# Patient Record
Sex: Male | Born: 1965 | Race: White | Hispanic: No | Marital: Married | Smoking: Never smoker
Health system: Southern US, Community
[De-identification: ages and names within clinical notes are randomized; demographics above are authoritative.]

## PROBLEM LIST (undated history)

## (undated) DIAGNOSIS — E079 Disorder of thyroid, unspecified: Secondary | ICD-10-CM

## (undated) DIAGNOSIS — E785 Hyperlipidemia, unspecified: Secondary | ICD-10-CM

## (undated) HISTORY — PX: APPENDECTOMY: SHX54

---

## 2012-01-30 DIAGNOSIS — Z9049 Acquired absence of other specified parts of digestive tract: Secondary | ICD-10-CM | POA: Insufficient documentation

## 2012-11-10 DIAGNOSIS — L290 Pruritus ani: Secondary | ICD-10-CM | POA: Insufficient documentation

## 2013-08-13 ENCOUNTER — Emergency Department (INDEPENDENT_AMBULATORY_CARE_PROVIDER_SITE_OTHER)
Admission: EM | Admit: 2013-08-13 | Discharge: 2013-08-13 | Disposition: A | Discharge status: Home / Self Care | Payer: Managed Care, Other (non HMO) | Source: Home / Self Care | Attending: Emergency Medicine | Admitting: Emergency Medicine

## 2013-08-13 ENCOUNTER — Encounter: Payer: Self-pay | Admitting: Emergency Medicine

## 2013-08-13 DIAGNOSIS — J029 Acute pharyngitis, unspecified: Secondary | ICD-10-CM

## 2013-08-13 DIAGNOSIS — J209 Acute bronchitis, unspecified: Secondary | ICD-10-CM

## 2013-08-13 LAB — POCT RAPID STREP A (OFFICE): Rapid Strep A Screen: NEGATIVE

## 2013-08-13 MED ORDER — AZITHROMYCIN 250 MG PO TABS: ORAL_TABLET | ORAL | Status: DC

## 2013-08-13 MED ORDER — PROMETHAZINE-CODEINE 6.25-10 MG/5ML PO SYRP: ORAL_SOLUTION | ORAL | Status: DC

## 2013-08-13 NOTE — ED Notes (Signed)
Melvin Clayton reports sore throat, fever, chills, sweats and cough for 4 days.

## 2013-08-13 NOTE — ED Provider Notes (Signed)
CSN: [id number]     Arrival date & time 08/13/13  1344 History   First MD Initiated Contact with Patient 08/13/13 1438     Chief Complaint  Patient presents with  . Sore Throat    4 days  . Fever  . Chills   (Consider location/radiation/quality/duration/timing/severity/associated sxs/prior Treatment) HPI Keagon reports sore throat, fever, chills, sweats and cough for 4 days.   URI HISTORY  Calvert is a 47 y.o. male who complains of onset of gradually progressive cold/cough symptoms for 4 days.  Have been using over-the-counter treatment which helps a little bit.  Positive chills/sweats +  Fever  +  Nasal congestion +  Discolored Post-nasal drainage No sinus pain/pressure Positive sore throat  +  Cough, severe, hacking, occasionally productive of discolored sputum. Cough keeps him up at night No wheezing +chest congestion No hemoptysis No shortness of breath No pleuritic pain, but it hurts to cough  No itchy/red eyes No earache  No nausea No vomiting No abdominal pain No diarrhea  No skin rashes +  mildFatigue No myalgias or arthralgias No headache  History reviewed. No pertinent past medical history. History reviewed. No pertinent past surgical history. History reviewed. No pertinent family history. History  Substance Use Topics  . Smoking status: Never Smoker   . Smokeless tobacco: Never Used  . Alcohol Use: No    Review of Systems  Allergies  Review of patient's allergies indicates no known allergies.  Home Medications   Current Outpatient Rx  Name  Route  Sig  Dispense  Refill  . Pseudoeph-Bromphen-DM (COLD & COUGH DM PO)   Oral   Take by mouth.         Marland Kitchen azithromycin (ZITHROMAX Z-PAK) 250 MG tablet      Take 2 tablets on day one, then 1 tablet daily on days 2 through 5   1 each   0   . promethazine-codeine (PHENERGAN WITH CODEINE) 6.25-10 MG/5ML syrup      Take 1-2 teaspoons every 4-6 hours as needed for cough. May cause drowsiness.  120 mL   0    BP 113/73  Pulse 76  Temp(Src) 98.4 F (36.9 C) (Oral)  Ht 6\' 4"  (1.93 m)  Wt 218 lb (98.884 kg)  BMI 26.55 kg/m2  SpO2 98% Physical Exam  Nursing note and vitals reviewed. Constitutional: He is oriented to person, place, and time. He appears well-developed and well-nourished. No distress.  HENT:  Head: Normocephalic and atraumatic.  Right Ear: Tympanic membrane normal.  Left Ear: Tympanic membrane normal.  Nose: Nose normal.  Mouth/Throat: Mucous membranes are normal. No oral lesions. No trismus in the jaw. Posterior oropharyngeal erythema present. No oropharyngeal exudate, posterior oropharyngeal edema or tonsillar abscesses.  Eyes: Right eye exhibits no discharge. Left eye exhibits no discharge. No scleral icterus.  Neck: Neck supple.  Cardiovascular: Normal rate, regular rhythm and normal heart sounds.   Pulmonary/Chest: No respiratory distress. He has no wheezes. He has rhonchi. He has no rales.  Lymphadenopathy:    He has no cervical adenopathy.  Neurological: He is alert and oriented to person, place, and time.  Skin: Skin is warm and dry.    ED Course  Procedures (including critical care time) Labs Review Labs Reviewed  POCT RAPID STREP A (OFFICE) - Normal   Imaging Review No results found.  EKG Interpretation    Date/Time:    Ventricular Rate:    PR Interval:    QRS Duration:   QT Interval:  QTC Calculation:   R Axis:     Text Interpretation:              MDM   1. Acute bronchitis   2. Sore throat    Rapid strep test negative. Treatment options discussed. Z-Pak Promethazine with codeine cough syrup prescribed. Precautions discussed. Other symptomatic care discussed Followup with PCP within one week, sooner if worse or new symptoms Precautions discussed. Red flags discussed. Questions invited and answered. Patient voiced understanding and agreement.    Lajean Manes, MD 08/13/13 [id number]

## 2016-05-24 ENCOUNTER — Other Ambulatory Visit: Payer: Self-pay | Admitting: Unknown Physician Specialty

## 2016-05-24 DIAGNOSIS — M545 Low back pain: Secondary | ICD-10-CM

## 2016-06-04 ENCOUNTER — Ambulatory Visit (INDEPENDENT_AMBULATORY_CARE_PROVIDER_SITE_OTHER): Payer: Managed Care, Other (non HMO)

## 2016-06-04 DIAGNOSIS — M545 Low back pain: Secondary | ICD-10-CM | POA: Diagnosis not present

## 2017-06-23 ENCOUNTER — Emergency Department (INDEPENDENT_AMBULATORY_CARE_PROVIDER_SITE_OTHER)
Admission: EM | Admit: 2017-06-23 | Discharge: 2017-06-23 | Disposition: A | Discharge status: Home / Self Care | Payer: BLUE CROSS/BLUE SHIELD | Source: Home / Self Care | Attending: Emergency Medicine | Admitting: Emergency Medicine

## 2017-06-23 ENCOUNTER — Encounter: Payer: Self-pay | Admitting: *Deleted

## 2017-06-23 ENCOUNTER — Emergency Department (INDEPENDENT_AMBULATORY_CARE_PROVIDER_SITE_OTHER): Payer: BLUE CROSS/BLUE SHIELD

## 2017-06-23 DIAGNOSIS — J209 Acute bronchitis, unspecified: Secondary | ICD-10-CM

## 2017-06-23 DIAGNOSIS — R05 Cough: Secondary | ICD-10-CM | POA: Diagnosis not present

## 2017-06-23 MED ORDER — AZITHROMYCIN 250 MG PO TABS: ORAL_TABLET | ORAL | 0 refills | Status: DC

## 2017-06-23 MED ORDER — BENZONATATE 100 MG PO CAPS: 100.0000 mg | ORAL_CAPSULE | Freq: Three times a day (TID) | ORAL | 0 refills | Status: DC | PRN

## 2017-06-23 NOTE — ED Provider Notes (Signed)
[facility]    CSN: [id number] Arrival date & time: 06/23/17  0831     History   Chief Complaint Chief Complaint  Patient presents with  . Cough    HPI Melvin Clayton is a 51 y.o. male.  Patient presents with a 9 day history of cough. He has had minimal head congestion. He has had no fever. His cough is productive of a minimal amount of phlegm. He denies any wheezing. He states he has no history of recurrent problems with his lungs. He has been taking over-the-counter medication without much improvement.No one else at home or work is sick.  Cough  Associated symptoms: no chills, no fever, no shortness of breath, no sore throat and no wheezing     History reviewed. No pertinent past medical history.  There are no active problems to display for this patient.   Past Surgical History:  Procedure Laterality Date  . APPENDECTOMY         Home Medications    Prior to Admission medications   Medication Sig Start Date End Date Taking? Authorizing Provider  Multiple Vitamin (MULTI-VITAMIN PO) Take by mouth.   Yes [provider]    Family History History reviewed. No pertinent family history.  Social History Social History   Tobacco Use  . Smoking status: Never Smoker  . Smokeless tobacco: Never Used  Substance Use Topics  . Alcohol use: Yes    Comment: 1 q wk  . Drug use: No     Allergies   Patient has no known allergies.   Review of Systems Review of Systems  Constitutional: Negative for activity change, chills and fever.  HENT: Positive for congestion. Negative for sore throat.   Respiratory: Positive for cough. Negative for choking, chest tightness, shortness of breath and wheezing.   Cardiovascular: Negative.      Physical Exam Triage Vital Signs ED Triage Vitals  Enc Vitals Group     BP 06/23/17 0908 126/78     Pulse Rate 06/23/17 0908 69     Resp 06/23/17 0908 16     Temp 06/23/17 0908 98.5 F (36.9 C)     Temp Source  06/23/17 0908 Oral     SpO2 06/23/17 0908 96 %     Weight 06/23/17 0908 231 lb (104.8 kg)     Height 06/23/17 0908 6\' 2"  (1.88 m)     Head Circumference --      Peak Flow --      Pain Score 06/23/17 0909 0     Pain Loc --      Pain Edu? --      Excl. in GC? --    No data found.  Updated Vital Signs BP 126/78 (BP Location: Left Arm)   Pulse 69   Temp 98.5 F (36.9 C) (Oral)   Resp 16   Ht 6\' 2"  (1.88 m)   Wt 231 lb (104.8 kg)   SpO2 96%   BMI 29.66 kg/m   Visual Acuity Right Eye Distance:   Left Eye Distance:   Bilateral Distance:    Right Eye Near:   Left Eye Near:    Bilateral Near:     Physical Exam  Constitutional: He appears well-developed and well-nourished.  HENT:  Head: Normocephalic.  Nose: Nose normal.  Mouth/Throat: Oropharynx is clear and moist. No oropharyngeal exudate.  Neck: Normal range of motion. Neck supple.  Cardiovascular: Normal rate, regular rhythm and normal heart sounds.  Pulmonary/Chest: Effort normal and breath  sounds normal. No stridor. No respiratory distress. He has no wheezes. He has no rales. He exhibits no tenderness.     UC Treatments / Results  Labs (all labs ordered are listed, but only abnormal results are displayed) Labs Reviewed - No data to display  EKG  EKG Interpretation None       Radiology Dg Chest 2 View  Result Date: 06/23/2017 CLINICAL DATA:  Cough. EXAM: CHEST  2 VIEW COMPARISON:  None. FINDINGS: The heart size and mediastinal contours are within normal limits. Both lungs are clear. The visualized skeletal structures are unremarkable. IMPRESSION: Normal chest. Electronically Signed   By: Francene BoyersJames  Maxwell M.D.   On: 06/23/2017 09:51    Procedures Procedures (including critical care time)  Medications Ordered in UC Medications - No data to display   Initial Impression / Assessment and Plan / UC Course  I have reviewed the triage vital signs and the nursing notes.  Pertinent labs & imaging results that  were available during my care of the patient were reviewed by me and considered in my medical decision making (see chart for details).     Chest x-ray is clear. Will treat with a Z-Pak along with Jerilynn Somessalon Perles patient given instructions regarding need for follow-up.  Final Clinical Impressions(s) / UC Diagnoses   Final diagnoses:  Acute bronchitis, unspecified organism    New Prescriptions This SmartLink is deprecated. Use AVSMEDLIST instead to display the medication list for a patient.   Controlled Substance Prescriptions [place] Controlled Substance Registry consulted? Not Applicable   Collene Gobbleaub, Coen Miyasato A, MD 06/23/17 1001

## 2017-06-23 NOTE — ED Triage Notes (Signed)
Pt c/o nonproductive cough x 9 days. Denies fever. He has taken OTC cough meds with minimal relief.

## 2018-01-20 ENCOUNTER — Emergency Department (INDEPENDENT_AMBULATORY_CARE_PROVIDER_SITE_OTHER)
Admission: EM | Admit: 2018-01-20 | Discharge: 2018-01-20 | Disposition: A | Discharge status: Home / Self Care | Payer: BLUE CROSS/BLUE SHIELD | Source: Home / Self Care | Attending: Family Medicine | Admitting: Family Medicine

## 2018-01-20 ENCOUNTER — Other Ambulatory Visit: Payer: Self-pay

## 2018-01-20 DIAGNOSIS — R350 Frequency of micturition: Secondary | ICD-10-CM

## 2018-01-20 DIAGNOSIS — R42 Dizziness and giddiness: Secondary | ICD-10-CM

## 2018-01-20 DIAGNOSIS — R300 Dysuria: Secondary | ICD-10-CM | POA: Diagnosis not present

## 2018-01-20 DIAGNOSIS — M545 Low back pain, unspecified: Secondary | ICD-10-CM

## 2018-01-20 LAB — POCT URINALYSIS DIP (MANUAL ENTRY)
Bilirubin, UA: NEGATIVE
Glucose, UA: NEGATIVE mg/dL
Ketones, POC UA: NEGATIVE mg/dL
Nitrite, UA: NEGATIVE
Protein Ur, POC: NEGATIVE mg/dL
Spec Grav, UA: 1.015 (ref 1.010–1.025)
Urobilinogen, UA: 0.2 E.U./dL
pH, UA: 6 (ref 5.0–8.0)

## 2018-01-20 MED ORDER — SULFAMETHOXAZOLE-TRIMETHOPRIM 800-160 MG PO TABS: 1.0000 | ORAL_TABLET | Freq: Two times a day (BID) | ORAL | 0 refills | Status: AC

## 2018-01-20 NOTE — ED Provider Notes (Signed)
[facility]    CSN: [id number] Arrival date & time: 01/20/18  1746     History   Chief Complaint Chief Complaint  Patient presents with  . Dizziness  . Fever  . Back Pain  . Urinary Frequency    HPI Melvin Clayton is a 52 y.o. male.   HPI Melvin Clayton is a 52 y.o. male presenting to UC with c/o urinary frequency, dysuria, fatigue, lower back pain, chills and pressure in his bladder since about 10AM yesterday.  He has taken ibuprofen with mild relief. Hx of UTI several years ago.  Denies known fever. Denies vomiting or diarrhea.    History reviewed. No pertinent past medical history.  There are no active problems to display for this patient.   Past Surgical History:  Procedure Laterality Date  . APPENDECTOMY         Home Medications    Prior to Admission medications   Medication Sig Start Date End Date Taking? Authorizing Provider  azithromycin (ZITHROMAX) 250 MG tablet Take 2 tabs PO x 1 dose, then 1 tab PO QD x 4 days 06/23/17   Collene Gobbleaub, Steven A, MD  benzonatate (TESSALON) 100 MG capsule Take 1-2 capsules (100-200 mg total) 3 (three) times daily as needed by mouth for cough. 06/23/17   Collene Gobbleaub, Steven A, MD  Multiple Vitamin (MULTI-VITAMIN PO) Take by mouth.    [provider]  sulfamethoxazole-trimethoprim (BACTRIM DS,SEPTRA DS) 800-160 MG tablet Take 1 tablet by mouth 2 (two) times daily for 7 days. 01/20/18 01/27/18  Lurene ShadowPhelps, Rodney Yera O, PA-C    Family History History reviewed. No pertinent family history.  Social History Social History   Tobacco Use  . Smoking status: Never Smoker  . Smokeless tobacco: Never Used  Substance Use Topics  . Alcohol use: Yes    Comment: 1 q wk  . Drug use: No     Allergies   Patient has no known allergies.   Review of Systems Review of Systems  Constitutional: Positive for chills. Negative for fever.  HENT: Negative for congestion, ear pain, sore throat, trouble swallowing and voice change.     Respiratory: Negative for cough and shortness of breath.   Cardiovascular: Negative for chest pain and palpitations.  Gastrointestinal: Positive for abdominal pain ( bladder pressure) and nausea. Negative for diarrhea and vomiting.  Genitourinary: Positive for dysuria, frequency and urgency. Negative for discharge, flank pain, hematuria and testicular pain.  Musculoskeletal: Positive for back pain. Negative for arthralgias and myalgias.  Skin: Negative for rash.  Neurological: Positive for dizziness. Negative for syncope, light-headedness and headaches.     Physical Exam Triage Vital Signs ED Triage Vitals  Enc Vitals Group     BP 01/20/18 1808 (!) 159/90     Pulse Rate 01/20/18 1808 92     Resp --      Temp 01/20/18 1808 99 F (37.2 C)     Temp Source 01/20/18 1808 Oral     SpO2 01/20/18 1808 99 %     Weight 01/20/18 1809 226 lb (102.5 kg)     Height 01/20/18 1809 6\' 2"  (1.88 m)     Head Circumference --      Peak Flow --      Pain Score 01/20/18 1809 0     Pain Loc --      Pain Edu? --      Excl. in GC? --    No data found.  Updated Vital Signs BP (!) 159/90 (BP Location:  Right Arm) Comment: provider notified  Pulse 92   Temp 99 F (37.2 C) (Oral)   Ht 6\' 2"  (1.88 m)   Wt 226 lb (102.5 kg)   SpO2 99%   BMI 29.02 kg/m   Visual Acuity Right Eye Distance:   Left Eye Distance:   Bilateral Distance:    Right Eye Near:   Left Eye Near:    Bilateral Near:     Physical Exam  Constitutional: He is oriented to person, place, and time. He appears well-developed and well-nourished. No distress.  HENT:  Head: Normocephalic and atraumatic.  Mouth/Throat: Oropharynx is clear and moist.  Eyes: EOM are normal.  Neck: Normal range of motion. Neck supple.  Cardiovascular: Normal rate and regular rhythm.  Pulmonary/Chest: Effort normal and breath sounds normal. No stridor. No respiratory distress. He has no wheezes. He has no rales.  Abdominal: Soft. He exhibits no  distension. There is tenderness in the suprapubic area and left lower quadrant. There is no CVA tenderness.  Musculoskeletal: Normal range of motion.  Neurological: He is alert and oriented to person, place, and time.  Skin: Skin is warm and dry. He is not diaphoretic.  Psychiatric: He has a normal mood and affect. His behavior is normal.  Nursing note and vitals reviewed.    UC Treatments / Results  Labs (all labs ordered are listed, but only abnormal results are displayed) Labs Reviewed  POCT URINALYSIS DIP (MANUAL ENTRY) - Abnormal; Notable for the following components:      Result Value   Clarity, UA cloudy (*)    Blood, UA small (*)    Leukocytes, UA Small (1+) (*)    All other components within normal limits  URINE CULTURE    EKG None  Radiology No results found.  Procedures Procedures (including critical care time)  Medications Ordered in UC Medications - No data to display  Initial Impression / Assessment and Plan / UC Course  I have reviewed the triage vital signs and the nursing notes.  Pertinent labs & imaging results that were available during my care of the patient were reviewed by me and considered in my medical decision making (see chart for details).     Hx and UA c/w UTI Culture sent Will start on antibiotics while culture pending Encouraged to stay well hydrated F/u with PCP as needed.   Final Clinical Impressions(s) / UC Diagnoses   Final diagnoses:  Dysuria  Dizziness  Acute bilateral low back pain without sciatica  Urinary frequency     Discharge Instructions      You may take 500mg  acetaminophen every 4-6 hours or in combination with ibuprofen 400-600mg  every 6-8 hours as needed for pain, inflammation, and fever.  Be sure to drink at least eight 8oz glasses of water to stay well hydrated and get at least 8 hours of sleep at night, preferably more while sick.   Please take antibiotics as prescribed and be sure to complete entire  course even if you start to feel better to ensure infection does not come back.  Please follow up with family medicine in 1 week if not improving.     ED Prescriptions    Medication Sig Dispense Auth. Provider   sulfamethoxazole-trimethoprim (BACTRIM DS,SEPTRA DS) 800-160 MG tablet Take 1 tablet by mouth 2 (two) times daily for 7 days. 14 tablet Lurene Shadow, [place]     Controlled Substance Prescriptions [place] Controlled Substance Registry consulted? Not Applicable   Rolla Plate 01/20/18  1823  

## 2018-01-20 NOTE — Discharge Instructions (Signed)
°  You may take 500mg acetaminophen every 4-6 hours or in combination with ibuprofen 400-600mg every 6-8 hours as needed for pain, inflammation, and fever. ° °Be sure to drink at least eight 8oz glasses of water to stay well hydrated and get at least 8 hours of sleep at night, preferably more while sick.  ° °Please take antibiotics as prescribed and be sure to complete entire course even if you start to feel better to ensure infection does not come back. ° °Please follow up with family medicine in 1 week if not improving.  °

## 2018-01-20 NOTE — ED Triage Notes (Signed)
Started yesterday around 10 am.  Frequency with urination, fatigue, lower back pain.  Has been taking ibuprofen.  Chills, and pressure when feeding.

## 2018-01-22 ENCOUNTER — Emergency Department
Admission: EM | Admit: 2018-01-22 | Discharge: 2018-01-22 | Discharge status: Absent without leave | Payer: Self-pay | Source: Home / Self Care

## 2018-01-22 ENCOUNTER — Other Ambulatory Visit: Payer: Self-pay | Admitting: Emergency Medicine

## 2018-01-22 LAB — URINE CULTURE
MICRO NUMBER:: 90672113
SPECIMEN QUALITY:: ADEQUATE

## 2018-01-22 MED ORDER — CIPROFLOXACIN HCL 500 MG PO TABS: 500.0000 mg | ORAL_TABLET | Freq: Two times a day (BID) | ORAL | 0 refills | Status: DC

## 2018-12-23 ENCOUNTER — Emergency Department (INDEPENDENT_AMBULATORY_CARE_PROVIDER_SITE_OTHER)
Admission: EM | Admit: 2018-12-23 | Discharge: 2018-12-23 | Disposition: A | Discharge status: Home / Self Care | Payer: BLUE CROSS/BLUE SHIELD | Source: Home / Self Care

## 2018-12-23 ENCOUNTER — Other Ambulatory Visit: Payer: Self-pay

## 2018-12-23 ENCOUNTER — Encounter: Payer: Self-pay | Admitting: *Deleted

## 2018-12-23 DIAGNOSIS — L298 Other pruritus: Secondary | ICD-10-CM | POA: Diagnosis not present

## 2018-12-23 HISTORY — DX: Hyperlipidemia, unspecified: E78.5

## 2018-12-23 HISTORY — DX: Disorder of thyroid, unspecified: E07.9

## 2018-12-23 MED ORDER — TRIAMCINOLONE ACETONIDE 0.1 % EX CREA: 1.0000 "application " | TOPICAL_CREAM | Freq: Two times a day (BID) | CUTANEOUS | 0 refills | Status: AC

## 2018-12-23 MED ORDER — CETIRIZINE HCL 10 MG PO TABS: 10.0000 mg | ORAL_TABLET | Freq: Every day | ORAL | 0 refills | Status: AC

## 2018-12-23 MED ORDER — PREDNISONE 50 MG PO TABS: 50.0000 mg | ORAL_TABLET | Freq: Every day | ORAL | 0 refills | Status: AC

## 2018-12-23 MED ORDER — METHYLPREDNISOLONE ACETATE 80 MG/ML IJ SUSP
80.0000 mg | Freq: Once | INTRAMUSCULAR | Status: AC
  Administered 2018-12-23: 80 mg via INTRAMUSCULAR

## 2018-12-23 NOTE — Discharge Instructions (Signed)
°  You were given your first dose of steroids today in the clinic, you may start your oral prednisone tomorrow with breakfast.  You may take the prescribed antihistamine cetirizine to help with inflammation and itching. This medication works best if taken daily for at least 1 week.  Please follow up with family medicine in 1 week if not improving, sooner if symptoms continue to worsen.

## 2018-12-23 NOTE — ED Provider Notes (Signed)
[facility]    CSN: [id number] Arrival date & time: 12/23/18  1004     History   Chief Complaint Chief Complaint  Patient presents with  . Rash    HPI Melvin Clayton is a 53 y.o. male.   HPI Melvin Clayton is a 53 y.o. male presenting to UC with c/o gradually worsening red moderately itchy rash all over his body. He was working in his yard about 2 weeks ago. At the end of the week he developed a rash he believed was from poison ivy, as he has had similar rashes in the past. He completed a 5 day course of prednisone prescribed from an e-visit last week. The rash started to improved but has since worsened after the prednisone was tapered off.  Denies pain, fever, or nausea. No oral swelling or trouble breathing. No other known allergens including new foods or medications.    Past Medical History:  Diagnosis Date  . Hyperlipidemia   . Thyroid disease     Patient Active Problem List   Diagnosis Date Noted  . 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 S/P laparoscopic appendectomy 01/30/2012    Past Surgical History:  Procedure Laterality Date  . APPENDECTOMY         Home Medications    Prior to Admission medications   Medication Sig Start Date End Date Taking? Authorizing Provider  Omega-3 Fatty Acids (FISH OIL PO) Take by mouth.   Yes [provider]  atorvastatin (LIPITOR) 10 MG tablet Take 10 mg by mouth daily. 11/09/18   [provider]  cetirizine (ZYRTEC) 10 MG tablet Take 1 tablet (10 mg total) by mouth daily. 12/23/18   Lurene Shadow, PA-C  glucosamine-chondroitin 500-400 MG tablet Take by mouth.    [provider]  levothyroxine (SYNTHROID) 50 MCG tablet Take 50 mcg by mouth daily. 11/09/18   [provider]  Multiple Vitamin (MULTI-VITAMIN PO) Take by mouth.    [provider]  predniSONE (DELTASONE) 50 MG tablet Take 1 tablet (50 mg total) by mouth daily with breakfast for 5 days. 12/23/18 12/28/18  Lurene Shadow, PA-C   triamcinolone cream (KENALOG) 0.1 % Apply 1 application topically 2 (two) times daily. 12/23/18   Lurene Shadow, PA-C    Family History History reviewed. No pertinent family history.  Social History Social History   Tobacco Use  . Smoking status: Never Smoker  . Smokeless tobacco: Never Used  Substance Use Topics  . Alcohol use: Yes    Comment: 1 q wk  . Drug use: No     Allergies   Patient has no known allergies.   Review of Systems Review of Systems  Constitutional: Negative for chills and fever.  HENT: Negative for facial swelling.   Respiratory: Negative for shortness of breath and wheezing.   Musculoskeletal: Negative for arthralgias, joint swelling and myalgias.  Skin: Positive for rash. Negative for wound.     Physical Exam Triage Vital Signs ED Triage Vitals [12/23/18 1018]  Enc Vitals Group     BP 118/75     Pulse Rate 70     Resp 18     Temp 97.8 F (36.6 C)     Temp Source Oral     SpO2 97 %     Weight 230 lb (104.3 kg)     Height 6\' 2"  (1.88 m)     Head Circumference      Peak Flow      Pain Score 0  Pain Loc      Pain Edu?      Excl. in GC?    No data found.  Updated Vital Signs BP 118/75 (BP Location: Right Arm)   Pulse 70   Temp 97.8 F (36.6 C) (Oral)   Resp 18   Ht 6\' 2"  (1.88 m)   Wt 230 lb (104.3 kg)   SpO2 97%   BMI 29.53 kg/m   Visual Acuity Right Eye Distance:   Left Eye Distance:   Bilateral Distance:    Right Eye Near:   Left Eye Near:    Bilateral Near:     Physical Exam Vitals signs and nursing note reviewed.  Constitutional:      Appearance: Normal appearance. He is well-developed.  HENT:     Head: Normocephalic and atraumatic.     Mouth/Throat:     Mouth: Mucous membranes are moist.     Pharynx: Oropharynx is clear.  Neck:     Musculoskeletal: Normal range of motion.  Cardiovascular:     Rate and Rhythm: Normal rate and regular rhythm.  Pulmonary:     Effort: Pulmonary effort is normal. No  respiratory distress.  Musculoskeletal: Normal range of motion.  Skin:    General: Skin is warm and dry.     Findings: Erythema and rash present.          Comments: Diffuse erythematous papular rash on arms, legs, and abdomen, linear distribution in some areas on lower legs. Rash on abdomen- maculopapular. Non-tender.   Neurological:     Mental Status: He is alert and oriented to person, place, and time.  Psychiatric:        Behavior: Behavior normal.      UC Treatments / Results  Labs (all labs ordered are listed, but only abnormal results are displayed) Labs Reviewed - No data to display  EKG None  Radiology No results found.  Procedures Procedures (including critical care time)  Medications Ordered in UC Medications  methylPREDNISolone acetate (DEPO-MEDROL) injection 80 mg (80 mg Intramuscular Given 12/23/18 1035)    Initial Impression / Assessment and Plan / UC Course  I have reviewed the triage vital signs and the nursing notes.  Pertinent labs & imaging results that were available during my care of the patient were reviewed by me and considered in my medical decision making (see chart for details).     Hx and exam c/w contact dermatitis and possible allergic reaction type rash on Left lower abdomen No evidence of underlying infection or anaphylaxis.   Final Clinical Impressions(s) / UC Diagnoses   Final diagnoses:  Pruritic erythematous rash     Discharge Instructions      You were given your first dose of steroids today in the clinic, you may start your oral prednisone tomorrow with breakfast.  You may take the prescribed antihistamine cetirizine to help with inflammation and itching. This medication works best if taken daily for at least 1 week.  Please follow up with family medicine in 1 week if not improving, sooner if symptoms continue to worsen.    ED Prescriptions    Medication Sig Dispense Auth. Provider   predniSONE (DELTASONE) 50 MG  tablet Take 1 tablet (50 mg total) by mouth daily with breakfast for 5 days. 5 tablet Waylan RocherPhelps, Jahmir Salo O, PA-C   cetirizine (ZYRTEC) 10 MG tablet Take 1 tablet (10 mg total) by mouth daily. 30 tablet Waylan RocherPhelps, Saide Lanuza O, PA-C   triamcinolone cream (KENALOG) 0.1 % Apply 1 application  topically 2 (two) times daily. 30 g Lurene Shadow, PA-C     Controlled Substance Prescriptions [place] Controlled Substance Registry consulted? Not Applicable   Rolla Plate 12/23/18 1102

## 2018-12-23 NOTE — ED Triage Notes (Signed)
Pt c/o rash on his arms, legs and abd x 2 wks. He completed 5 days of prednisone Friday from an e-visit.

## 2019-07-03 IMAGING — DX DG CHEST 2V
2 series · 2 of 2 positions shown · non-contrast
Comparison: None.

CLINICAL DATA: Cough.

EXAM:
CHEST  2 VIEW

[chest pa]
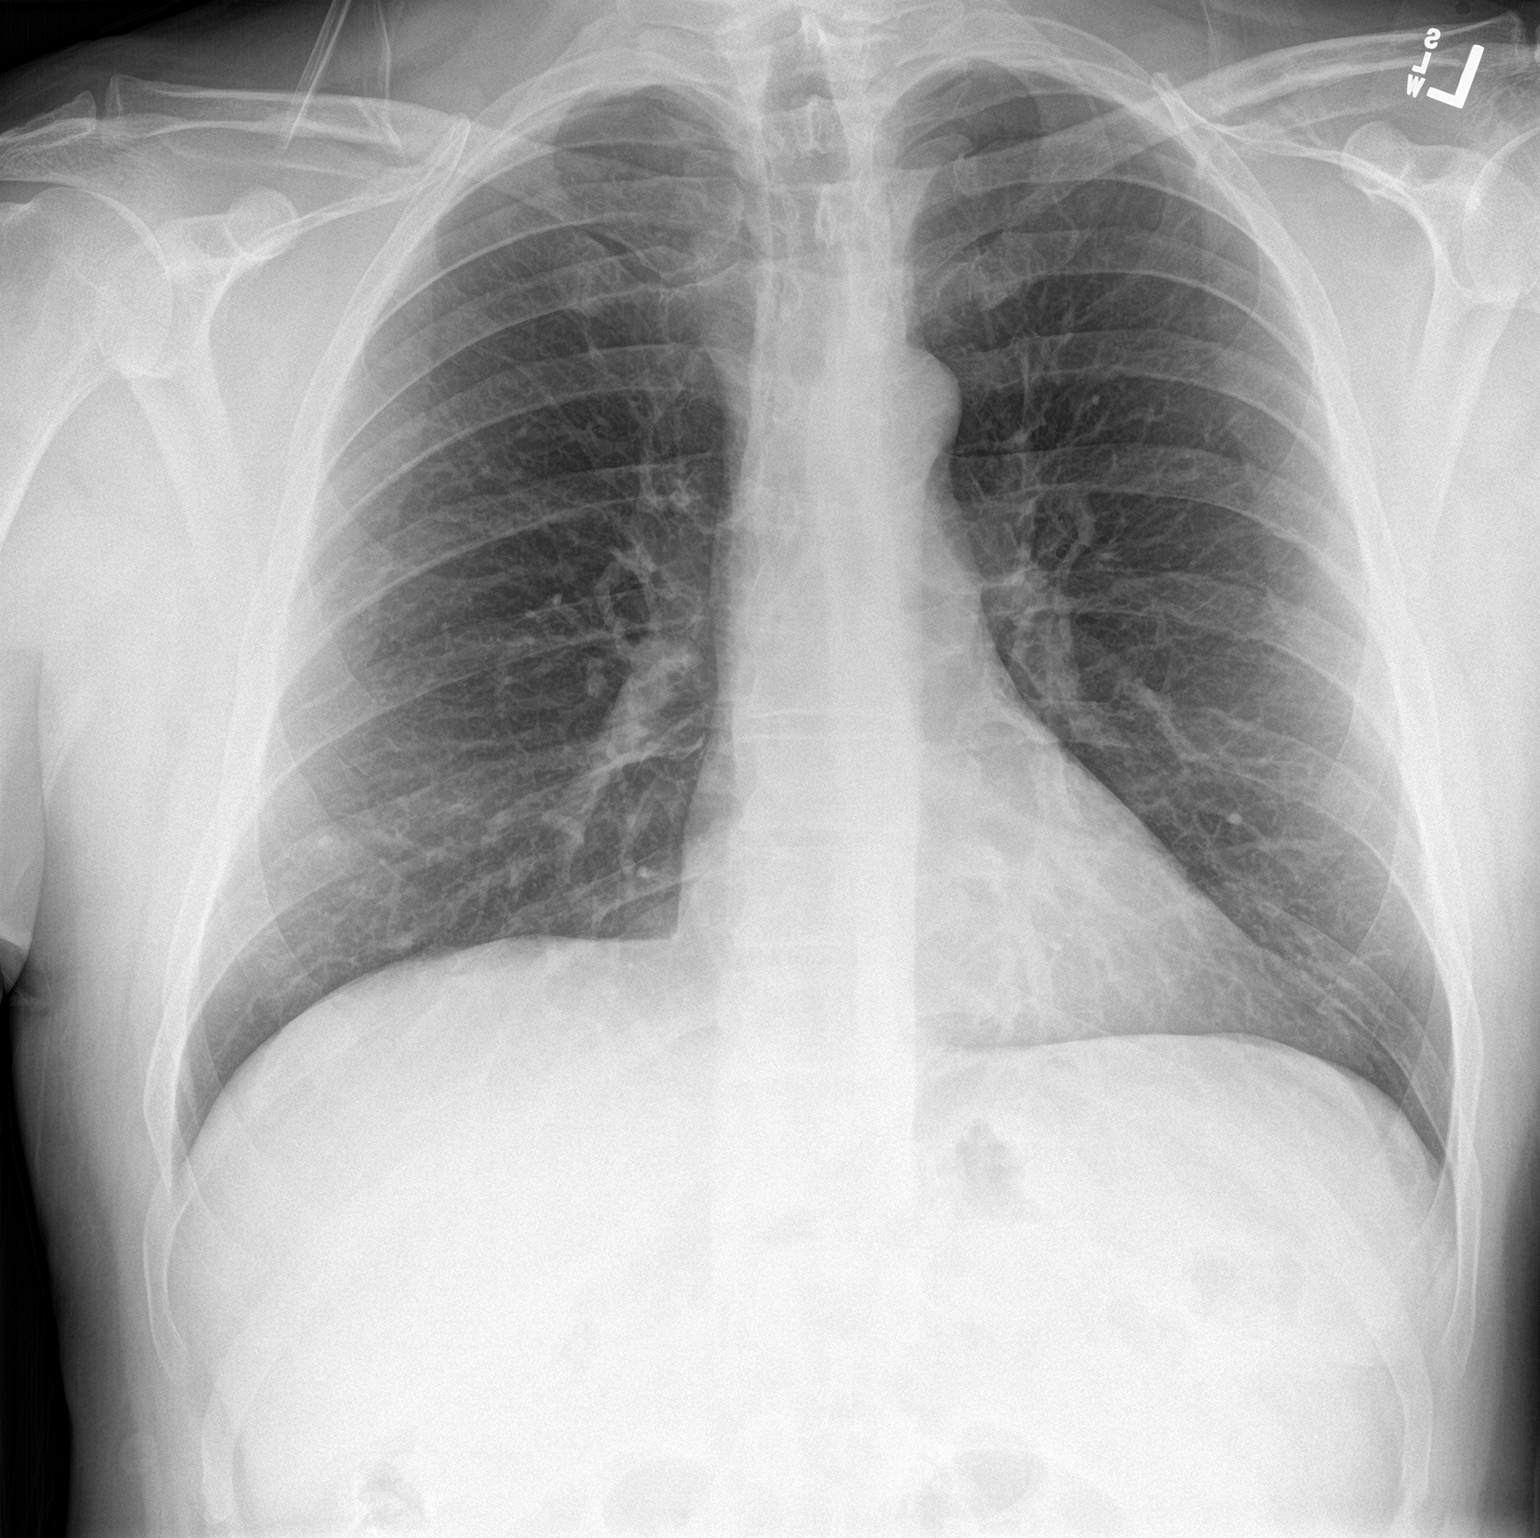

[chest lat]
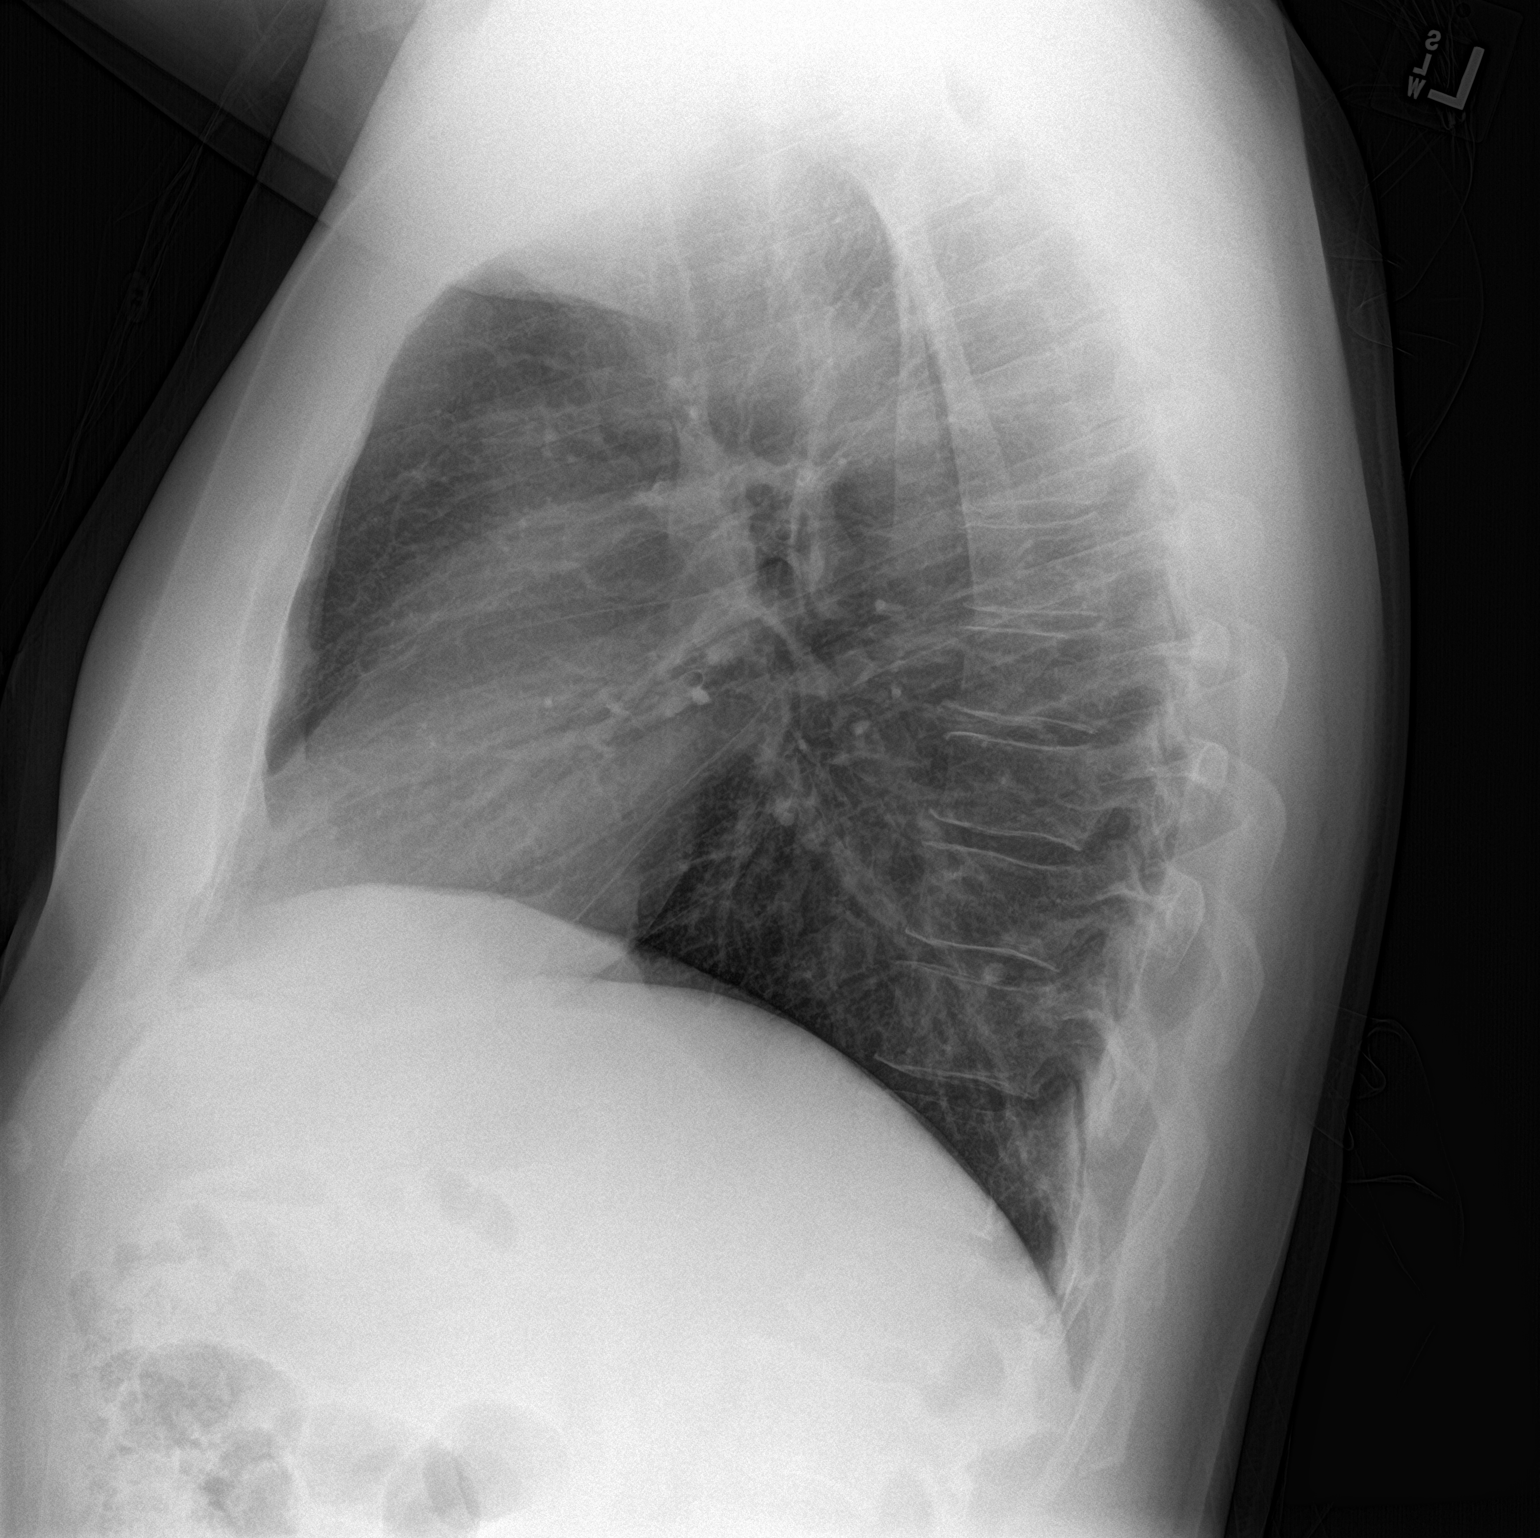

[2 of 2 positions shown; findings below may reference images not displayed]

FINDINGS: The heart size and mediastinal contours are within normal limits.
Both lungs are clear. The visualized skeletal structures are
unremarkable.
IMPRESSION: Normal chest.

## 2021-12-09 ENCOUNTER — Emergency Department (INDEPENDENT_AMBULATORY_CARE_PROVIDER_SITE_OTHER)
Admission: EM | Admit: 2021-12-09 | Discharge: 2021-12-09 | Disposition: A | Discharge status: Home / Self Care | Payer: BLUE CROSS/BLUE SHIELD | Source: Home / Self Care

## 2021-12-09 ENCOUNTER — Other Ambulatory Visit: Payer: Self-pay

## 2021-12-09 DIAGNOSIS — J0100 Acute maxillary sinusitis, unspecified: Secondary | ICD-10-CM | POA: Diagnosis not present

## 2021-12-09 DIAGNOSIS — R059 Cough, unspecified: Secondary | ICD-10-CM

## 2021-12-09 DIAGNOSIS — J309 Allergic rhinitis, unspecified: Secondary | ICD-10-CM

## 2021-12-09 MED ORDER — AMOXICILLIN-POT CLAVULANATE 875-125 MG PO TABS: 1.0000 | ORAL_TABLET | Freq: Two times a day (BID) | ORAL | 0 refills | Status: DC

## 2021-12-09 MED ORDER — PREDNISONE 20 MG PO TABS: ORAL_TABLET | ORAL | 0 refills | Status: DC

## 2021-12-09 MED ORDER — FEXOFENADINE HCL 180 MG PO TABS: 180.0000 mg | ORAL_TABLET | Freq: Every day | ORAL | 0 refills | Status: DC

## 2021-12-09 NOTE — ED Provider Notes (Signed)
For ?[facility] ? ? ? ?CSN: [id number] ?Arrival date & time: 12/09/21  1026 ? ? ?  ? ?History   ?Chief Complaint ?Chief Complaint  ?Patient presents with  ? Cough  ?  X4-5 days ? ?Cough, chest congestion, nasal congestion and body aches.   ? ? ?HPI ?Melvin [facility] is a 56 y.o. male.  ? ?HPI 65-year-old male presents with cough, chest congestion, nasal congestion and body aches for 5 days.  Reports negative COVID test 2 days ago.  PMH significant for HLD and hypothyroidism. ? ?Past Medical History:  ?Diagnosis Date  ? Hyperlipidemia   ? Thyroid disease   ? ? ?Patient Active Problem List  ? Diagnosis Date Noted  ? Pruritus ani 11/10/2012  ? S/P laparoscopic appendectomy 01/30/2012  ? ? ?Past Surgical History:  ?Procedure Laterality Date  ? APPENDECTOMY    ? ? ? ? ? ?Home Medications   ? ?Prior to Admission medications   ?Medication Sig Start Date End Date Taking? Authorizing Provider  ?amoxicillin-clavulanate (AUGMENTIN) 875-125 MG tablet Take 1 tablet by mouth every 12 (twelve) hours. 12/09/21  Yes Trevor Iha, FNP  ?atorvastatin (LIPITOR) 10 MG tablet Take 10 mg by mouth daily. 11/09/18  Yes [provider]  ?fexofenadine (ALLEGRA ALLERGY) 180 MG tablet Take 1 tablet (180 mg total) by mouth daily for 15 days. 12/09/21 12/24/21 Yes Trevor Iha, FNP  ?levothyroxine (SYNTHROID) 75 MCG tablet Take 75 mcg by mouth daily. 11/19/21  Yes [provider]  ?Multiple Vitamin (MULTI-VITAMIN PO) Take by mouth.   Yes [provider]  ?Omega-3 Fatty Acids (FISH OIL PO) Take by mouth.   Yes [provider]  ?predniSONE (DELTASONE) 20 MG tablet Take 3 tabs PO daily x 5 days. 12/09/21  Yes Trevor Iha, FNP  ?cetirizine (ZYRTEC) 10 MG tablet Take 1 tablet (10 mg total) by mouth daily. 12/23/18   Lurene Shadow, PA-C  ?glucosamine-chondroitin 500-400 MG tablet Take by mouth.    [provider]  ?levothyroxine (SYNTHROID) 50 MCG tablet Take 50 mcg by mouth daily. 11/09/18   [provider]  ?triamcinolone cream (KENALOG) 0.1 % Apply 1 application topically 2 (two) times daily. 12/23/18   Lurene Shadow, PA-C  ? ? ?Family History ?History reviewed. No pertinent family history. ? ?Social History ?Social History  ? ?Tobacco Use  ? Smoking status: Never  ? Smokeless tobacco: Never  ?Vaping Use  ? Vaping Use: Never used  ?Substance Use Topics  ? Alcohol use: Not Currently  ?  Comment: 1 q wk  ? Drug use: No  ? ? ? ?Allergies   ?Patient has no known allergies. ? ? ?Review of Systems ?Review of Systems  ?HENT:  Positive for congestion.   ?Musculoskeletal:  Positive for myalgias.  ?All other systems reviewed and are negative. ? ? ?Physical Exam ?Triage Vital Signs ?ED Triage Vitals  ?Enc Vitals Group  ?   BP 12/09/21 1042 128/80  ?   Pulse Rate 12/09/21 1042 96  ?   Resp 12/09/21 1042 18  ?   Temp 12/09/21 1042 98.2 ?F (36.8 ?C)  ?   Temp Source 12/09/21 1042 Oral  ?   SpO2 12/09/21 1042 95 %  ?   Weight 12/09/21 1040 230 lb (104.3 kg)  ?   Height 12/09/21 1040 6\' 2"  (1.88 m)  ?   Head Circumference --   ?   Peak Flow --   ?   Pain Score 12/09/21 1040 0  ?  Pain Loc --   ?   Pain Edu? --   ?   Excl. in GC? --   ? ?No data found. ? ?Updated Vital Signs ?BP 128/80 (BP Location: Right Arm)   Pulse 96   Temp 98.2 ?F (36.8 ?C) (Oral)   Resp 18   Ht  (1.88 m)   Wt 230 lb (104.3 kg)   SpO2 95%   BMI 29.53 kg/m?  ? ? ?Physical Exam ?Vitals and nursing note reviewed.  ?Constitutional:   ?   Appearance: Normal appearance. He is normal weight.  ?HENT:  ?   Head: Normocephalic and atraumatic.  ?   Right Ear: Tympanic membrane and external ear normal.  ?   Left Ear: External ear normal.  ?   Ears:  ?   Comments: Moderate to significant eustachian tube dysfunction; left TM-clear, retracted with moderate serous effusions noted ?   Nose:  ?   Comments: Turbinates are erythematous/edematous ?   Mouth/Throat:  ?   Mouth: Mucous membranes are moist.  ?   Pharynx: Oropharynx is clear.  ?   Comments:  Significant to moderate amount of clear drainage of posterior oropharynx noted ?Eyes:  ?   Extraocular Movements: Extraocular movements intact.  ?   Conjunctiva/sclera: Conjunctivae normal.  ?   Pupils: Pupils are equal, round, and reactive to light.  ?Cardiovascular:  ?   Rate and Rhythm: Normal rate and regular rhythm.  ?   Pulses: Normal pulses.  ?   Heart sounds: Normal heart sounds.  ?Pulmonary:  ?   Effort: Pulmonary effort is normal.  ?   Breath sounds: Normal breath sounds. No wheezing, rhonchi or rales.  ?   Comments: Infrequent nonproductive cough noted on exam ?Musculoskeletal:  ?   Cervical back: Normal range of motion and neck supple.  ?Skin: ?   General: Skin is warm and dry.  ?Neurological:  ?   General: No focal deficit present.  ?   Mental Status: He is alert and oriented to person, place, and time.  ? ? ? ?UC Treatments / Results  ?Labs ?(all labs ordered are listed, but only abnormal results are displayed) ?Labs Reviewed - No data to display ? ?EKG ? ? ?Radiology ?No results found. ? ?Procedures ?Procedures (including critical care time) ? ?Medications Ordered in UC ?Medications - No data to display ? ?Initial Impression / Assessment and Plan / UC Course  ?I have reviewed the triage vital signs and the nursing notes. ? ?Pertinent labs & imaging results that were available during my care of the patient were reviewed by me and considered in my medical decision making (see chart for details). ? ?  ? ?MDM: 1.  Acute maxillary sinusitis, recurrence not specified-Rx'd Augmentin; 2.  Cough-Rx'd prednisone; 3.  Allergic rhinitis-Rx'd Allegra. Instructed patient to take medication as directed with food to completion.  Advised patient to take prednisone and Allegra with first dose of Augmentin for the next 5 of 7 days.  May use Allegra as needed afterwards for concurrent postnasal drainage/drip.  Encouraged patient to increase daily water intake.  Patient discharged home, hemodynamically stable. ? ?Final  Clinical Impressions(s) / UC Diagnoses  ? ?Final diagnoses:  ?Acute maxillary sinusitis, recurrence not specified  ?Cough, unspecified type  ?Allergic rhinitis, unspecified seasonality, unspecified trigger  ? ? ? ?Discharge Instructions   ? ?  ?Instructed patient to take medication as directed with food to completion.  Advised patient to take prednisone and Allegra with first dose  of Augmentin for the next 5 of 7 days.  May use Allegra as needed afterwards for concurrent postnasal drainage/drip.  Encouraged patient to increase daily water intake. ? ? ? ? ?ED Prescriptions   ? ? Medication Sig Dispense Auth. Provider  ? amoxicillin-clavulanate (AUGMENTIN) 875-125 MG tablet Take 1 tablet by mouth every 12 (twelve) hours. 14 tablet Trevor Iha, FNP  ? predniSONE (DELTASONE) 20 MG tablet Take 3 tabs PO daily x 5 days. 15 tablet Trevor Iha, FNP  ? fexofenadine (ALLEGRA ALLERGY) 180 MG tablet Take 1 tablet (180 mg total) by mouth daily for 15 days. 15 tablet Trevor Iha, FNP  ? ?  ? ?PDMP not reviewed this encounter. ?  ?Trevor Iha, FNP ?12/09/21 1145 ? ?

## 2021-12-09 NOTE — ED Triage Notes (Signed)
Pt states that he has a cough, chest congestion, nasal congestion and body aches. X4-5 days ? ?Pt states that he had a negative covid test 2 days ago. ? ?Pt states that he is vaccinated for covid. ?Pt states that he hasn't had flu vaccine. ?

## 2021-12-09 NOTE — Discharge Instructions (Addendum)
Instructed patient to take medication as directed with food to completion.  Advised patient to take prednisone and Allegra with first dose of Augmentin for the next 5 of 7 days.  May use Allegra as needed afterwards for concurrent postnasal drainage/drip.  Encouraged patient to increase daily water intake. ?

## 2022-04-23 ENCOUNTER — Ambulatory Visit
Admission: EM | Admit: 2022-04-23 | Discharge: 2022-04-23 | Disposition: A | Discharge status: Home / Self Care | Payer: BC Managed Care – PPO | Attending: Family Medicine | Admitting: Family Medicine

## 2022-04-23 ENCOUNTER — Encounter: Payer: Self-pay | Admitting: Emergency Medicine

## 2022-04-23 DIAGNOSIS — J0100 Acute maxillary sinusitis, unspecified: Secondary | ICD-10-CM

## 2022-04-23 DIAGNOSIS — J309 Allergic rhinitis, unspecified: Secondary | ICD-10-CM

## 2022-04-23 DIAGNOSIS — J3489 Other specified disorders of nose and nasal sinuses: Secondary | ICD-10-CM

## 2022-04-23 MED ORDER — AMOXICILLIN-POT CLAVULANATE 875-125 MG PO TABS: 1.0000 | ORAL_TABLET | Freq: Two times a day (BID) | ORAL | 0 refills | Status: DC

## 2022-04-23 MED ORDER — FEXOFENADINE HCL 180 MG PO TABS: 180.0000 mg | ORAL_TABLET | Freq: Every day | ORAL | 0 refills | Status: AC

## 2022-04-23 MED ORDER — PREDNISONE 20 MG PO TABS: ORAL_TABLET | ORAL | 0 refills | Status: DC

## 2022-04-23 NOTE — ED Triage Notes (Signed)
Patient c/o sinus pressure and drainage, congestion, clear nasal drainage for a couple of days.  Patient has been taken OTC cold meds.

## 2022-04-23 NOTE — Discharge Instructions (Addendum)
Advised patient to discontinue Zyrtec.  Advised patient to take medication as directed with food to completion.  Advised patient to take prednisone and Allegra with first dose of Augmentin for the next 5 of 7 days.  Advised may use Allegra as needed afterwards for concurrent postnasal drainage/drip.  Encouraged patient increase daily water intake while taking these medication.

## 2022-04-23 NOTE — ED Provider Notes (Signed)
[facility]    CSN: [id number] Arrival date & time: 04/23/22  1311      History   Chief Complaint Chief Complaint  Patient presents with   Sinusitis    HPI Melvin Clayton is a 56 y.o. male.   HPI 56 year old male presents with sinus pressure, congestion, and nasal drainage for 2 to 3 days.  Patient reports taking OTC cold medicines with little to no relief.  PMH significant for obesity, HLD, and hypothyroidism.  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 Authorizing Provider  amoxicillin-clavulanate (AUGMENTIN) 875-125 MG tablet Take 1 tablet by mouth every 12 (twelve) hours. 04/23/22  Yes Trevor Iha, FNP  atorvastatin (LIPITOR) 10 MG tablet Take 10 mg by mouth daily. 11/09/18  Yes [provider]  fexofenadine (ALLEGRA ALLERGY) 180 MG tablet Take 1 tablet (180 mg total) by mouth daily for 15 days. 04/23/22 05/08/22 Yes Trevor Iha, FNP  glucosamine-chondroitin 500-400 MG tablet Take by mouth.   Yes [provider]  levothyroxine (SYNTHROID) 50 MCG tablet Take 50 mcg by mouth daily. 11/09/18  Yes [provider]  levothyroxine (SYNTHROID) 75 MCG tablet Take 75 mcg by mouth daily. 11/19/21  Yes [provider]  Multiple Vitamin (MULTI-VITAMIN PO) Take by mouth.   Yes [provider]  Omega-3 Fatty Acids (FISH OIL PO) Take by mouth.   Yes [provider]  predniSONE (DELTASONE) 20 MG tablet Take 3 tabs PO daily x 5 days. 04/23/22  Yes Trevor Iha, FNP  cetirizine (ZYRTEC) 10 MG tablet Take 1 tablet (10 mg total) by mouth daily. 12/23/18   Lurene Shadow, PA-C  triamcinolone cream  (KENALOG) 0.1 % Apply 1 application topically 2 (two) times daily. 12/23/18   Lurene Shadow, PA-C    Family History History reviewed. No pertinent family history.  Social History Social History   Tobacco Use   Smoking status: Never   Smokeless tobacco: Never  Vaping Use   Vaping Use: Never used  Substance Use Topics   Alcohol use: Not Currently    Comment: 1 q wk   Drug use: No     Allergies   Patient has no known allergies.   Review of Systems Review of Systems  HENT:  Positive for congestion, sinus pressure and sinus pain.   All other systems reviewed and are negative.    Physical Exam Triage Vital Signs ED Triage Vitals  Enc Vitals Group     BP 04/23/22 1321 118/84     Pulse Rate 04/23/22 1321 67     Resp 04/23/22 1321 18     Temp 04/23/22 1321 99.3 F (37.4 C)     Temp Source 04/23/22 1321 Oral     SpO2 04/23/22 1321 96 %     Weight 04/23/22 1323 230 lb (104.3 kg)     Height 04/23/22 1323 6\' 2"  (1.88 m)     Head Circumference --      Peak Flow --      Pain Score 04/23/22 1323 0     Pain Loc --  Pain Edu? --      Excl. in GC? --    No data found.  Updated Vital Signs BP 118/84 (BP Location: Right Arm)   Pulse 67   Temp 99.3 F (37.4 C) (Oral)   Resp 18   Ht 6\' 2"  (1.88 m)   Wt 230 lb (104.3 kg)   SpO2 96%   BMI 29.53 kg/m    Physical Exam Vitals and nursing note reviewed.  Constitutional:      Appearance: Normal appearance. He is obese. He is ill-appearing.  HENT:     Head: Normocephalic and atraumatic.     Right Ear: Tympanic membrane and external ear normal.     Left Ear: Tympanic membrane and external ear normal.     Ears:     Comments: Significant eustachian tube dysfunction noted bilaterally    Nose:     Right Sinus: Frontal sinus tenderness present.     Left Sinus: Frontal sinus tenderness present.     Comments: Turbinates are erythematous/edematous    Mouth/Throat:     Mouth: Mucous membranes are moist.     Pharynx:  Oropharynx is clear.     Comments: Significant amount of clear drainage of posterior oropharynx with concurrent postnasal pharyngeal cobblestoning noted Eyes:     Extraocular Movements: Extraocular movements intact.     Conjunctiva/sclera: Conjunctivae normal.     Pupils: Pupils are equal, round, and reactive to light.  Cardiovascular:     Rate and Rhythm: Normal rate and regular rhythm.     Pulses: Normal pulses.     Heart sounds: Normal heart sounds.  Pulmonary:     Effort: Pulmonary effort is normal.     Breath sounds: Normal breath sounds. No wheezing, rhonchi or rales.  Musculoskeletal:        General: Normal range of motion.     Cervical back: Normal range of motion and neck supple.  Lymphadenopathy:     Cervical: No cervical adenopathy.  Skin:    General: Skin is warm and dry.  Neurological:     General: No focal deficit present.     Mental Status: He is alert and oriented to person, place, and time. Mental status is at baseline.      UC Treatments / Results  Labs (all labs ordered are listed, but only abnormal results are displayed) Labs Reviewed - No data to display  EKG   Radiology No results found.  Procedures Procedures (including critical care time)  Medications Ordered in UC Medications - No data to display  Initial Impression / Assessment and Plan / UC Course  I have reviewed the triage vital signs and the nursing notes.  Pertinent labs & imaging results that were available during my care of the patient were reviewed by me and considered in my medical decision making (see chart for details).     MDM: 1.  Acute maxillary sinusitis, recurrence not specified-Rx'd Augmentin; 2.  Sinus pressure-Rx'd prednisone; 3.  Allergic rhinitis-next Allegra. Advised patient to discontinue Zyrtec.  Advised patient to take medication as directed with food to completion.  Advised patient to take prednisone and Allegra with first dose of Augmentin for the next 5 of 7  days.  Advised may use Allegra as needed afterwards for concurrent postnasal drainage/drip.  Encouraged patient increase daily water intake while taking these medication.  Patient discharged home, hemodynamically stable. Final Clinical Impressions(s) / UC Diagnoses   Final diagnoses:  Acute maxillary sinusitis, recurrence not specified  Sinus pressure  Allergic rhinitis, unspecified seasonality, unspecified trigger     Discharge Instructions      Advised patient to discontinue Zyrtec.  Advised patient to take medication as directed with food to completion.  Advised patient to take prednisone and Allegra with first dose of Augmentin for the next 5 of 7 days.  Advised may use Allegra as needed afterwards for concurrent postnasal drainage/drip.  Encouraged patient increase daily water intake while taking these medication.     ED Prescriptions     Medication Sig Dispense Auth. Provider   amoxicillin-clavulanate (AUGMENTIN) 875-125 MG tablet Take 1 tablet by mouth every 12 (twelve) hours. 14 tablet Trevor Iha, FNP   predniSONE (DELTASONE) 20 MG tablet Take 3 tabs PO daily x 5 days. 15 tablet Trevor Iha, FNP   fexofenadine [facility] ALLERGY) 180 MG tablet Take 1 tablet (180 mg total) by mouth daily for 15 days. 15 tablet Trevor Iha, FNP      PDMP not reviewed this encounter.   Trevor Iha, FNP 04/23/22 1356

## 2022-07-08 ENCOUNTER — Ambulatory Visit
Admission: RE | Admit: 2022-07-08 | Discharge: 2022-07-08 | Disposition: A | Discharge status: Home / Self Care | Payer: BC Managed Care – PPO | Source: Ambulatory Visit | Attending: Internal Medicine | Admitting: Internal Medicine

## 2022-07-08 VITALS — BP 132/81 | HR 72 | Temp 98.3°F | Resp 17

## 2022-07-08 DIAGNOSIS — U071 COVID-19: Secondary | ICD-10-CM

## 2022-07-08 MED ORDER — FLUTICASONE PROPIONATE 50 MCG/ACT NA SUSP: 1.0000 | Freq: Every day | NASAL | 0 refills | Status: AC

## 2022-07-08 MED ORDER — BENZONATATE 100 MG PO CAPS: 100.0000 mg | ORAL_CAPSULE | Freq: Three times a day (TID) | ORAL | 0 refills | Status: AC | PRN

## 2022-07-08 NOTE — ED Triage Notes (Signed)
Pt c/o cough and congestion since last Thurs. Tested pos for COVID on Saturday. Taking ibuprofen prn.

## 2022-07-08 NOTE — Discharge Instructions (Signed)
Please maintain adequate hydration Please take medications as prescribed Your quarantine ends today 07/08/2022.  You will need to wear a mask when you are around people until 07/13/2022 If you have any worsening symptoms please return to urgent care to be reevaluated

## 2022-07-08 NOTE — ED Provider Notes (Signed)
[facility]    CSN: [id number] Arrival date & time: 07/08/22  9767      History   Chief Complaint Chief Complaint  Patient presents with   Cough    COVID POS   Nasal Congestion    APPT 9am    HPI Melvin Clayton is a 56 y.o. male comes to the urgent care with nasal congestion and nonproductive cough.  Patient's symptoms started 4 days ago.  On Saturday patient tested positive using the home COVID test.  He has been taking ibuprofen as needed for pain.  He denies any shortness of breath, chest tightness or wheezing.  No nausea, vomiting or diarrhea.  Patient has mild intermittent generalized body aches.  He recently flew back from [place] and thought this is a sinus infection. HPI  Past Medical History:  Diagnosis Date   Hyperlipidemia    Thyroid disease     Patient Active Problem List   Diagnosis Date Noted   Pruritus ani 11/10/2012   S/P laparoscopic appendectomy 01/30/2012    Past Surgical History:  Procedure Laterality Date   APPENDECTOMY         Home Medications    Prior to Admission medications   Medication Sig Start Date End Date Taking? Authorizing Provider  benzonatate (TESSALON) 100 MG capsule Take 1 capsule (100 mg total) by mouth 3 (three) times daily as needed for cough. 07/08/22  Yes Lalitha Ilyas, Britta Mccreedy, MD  fluticasone (FLONASE) 50 MCG/ACT nasal spray Place 1 spray into both nostrils daily. 07/08/22  Yes Larua Collier, Britta Mccreedy, MD  atorvastatin (LIPITOR) 10 MG tablet Take 10 mg by mouth daily. 11/09/18   [provider]  cetirizine (ZYRTEC) 10 MG tablet Take 1 tablet (10 mg total) by mouth daily. 12/23/18   Lurene Shadow, PA-C  fexofenadine (ALLEGRA ALLERGY) 180 MG tablet Take 1 tablet (180 mg total) by mouth daily for 15 days. 04/23/22 05/08/22  Trevor Iha, FNP  glucosamine-chondroitin 500-400 MG tablet Take by mouth.    [provider]  levothyroxine (SYNTHROID) 50 MCG tablet Take 50 mcg by mouth daily. 11/09/18   [provider]  levothyroxine (SYNTHROID) 75 MCG tablet Take 75 mcg by mouth daily. 11/19/21   [provider]  Multiple Vitamin (MULTI-VITAMIN PO) Take by mouth.    [provider]  Omega-3 Fatty Acids (FISH OIL PO) Take by mouth.    [provider]  triamcinolone cream (KENALOG) 0.1 % Apply 1 application topically 2 (two) times daily. 12/23/18   Lurene Shadow, PA-C    Family History History reviewed. No pertinent family history.  Social History Social History   Tobacco Use   Smoking status: Never   Smokeless tobacco: Never  Vaping Use   Vaping Use: Never used  Substance Use Topics   Alcohol use: Not Currently    Comment: 1 q wk   Drug use: No     Allergies   Patient has no known allergies.   Review of Systems Review of Systems  HENT:  Positive for congestion. Negative for ear discharge and ear pain.   Respiratory:  Positive for cough. Negative for shortness of breath and wheezing.   Gastrointestinal: Negative.   Neurological: Negative.      Physical Exam Triage Vital Signs ED Triage Vitals  Enc Vitals Group     BP 07/08/22 0859 132/81     Pulse Rate 07/08/22 0859 72     Resp 07/08/22 0859 17     Temp 07/08/22 0859  98.3 F (36.8 C)     Temp Source 07/08/22 0859 Oral     SpO2 07/08/22 0859 97 %     Weight --      Height --      Head Circumference --      Peak Flow --      Pain Score 07/08/22 0901 0     Pain Loc --      Pain Edu? --      Excl. in GC? --    No data found.  Updated Vital Signs BP 132/81 (BP Location: Right Arm)   Pulse 72   Temp 98.3 F (36.8 C) (Oral)   Resp 17   SpO2 97%   Visual Acuity Right Eye Distance:   Left Eye Distance:   Bilateral Distance:    Right Eye Near:   Left Eye Near:    Bilateral Near:     Physical Exam Vitals and nursing note reviewed.  Constitutional:      Appearance: Normal appearance.  HENT:     Right Ear: Tympanic membrane normal.     Left Ear: Tympanic membrane normal.      Nose: Congestion present. No rhinorrhea.     Mouth/Throat:     Mouth: Mucous membranes are moist.     Pharynx: No posterior oropharyngeal erythema.  Eyes:     Extraocular Movements: Extraocular movements intact.     Pupils: Pupils are equal, round, and reactive to light.  Cardiovascular:     Rate and Rhythm: Normal rate and regular rhythm.     Pulses: Normal pulses.     Heart sounds: Normal heart sounds.  Pulmonary:     Effort: Pulmonary effort is normal.     Breath sounds: Normal breath sounds.  Abdominal:     General: Bowel sounds are normal.     Palpations: Abdomen is soft.  Neurological:     Mental Status: He is alert.      UC Treatments / Results  Labs (all labs ordered are listed, but only abnormal results are displayed) Labs Reviewed - No data to display  EKG   Radiology No results found.  Procedures Procedures (including critical care time)  Medications Ordered in UC Medications - No data to display  Initial Impression / Assessment and Plan / UC Course  I have reviewed the triage vital signs and the nursing notes.  Pertinent labs & imaging results that were available during my care of the patient were reviewed by me and considered in my medical decision making (see chart for details).     1.  COVID-19 virus infection: Patient's quarantine ends today 11/20 He feels better has no clear indication for the role of antivirals Tessalon Perles as needed for cough Ibuprofen as needed for pain/or fever. Fluticasone nasal spray Maintain adequate hydration Physical exam is reassuring. Return precautions given Final Clinical Impressions(s) / UC Diagnoses   Final diagnoses:  COVID-19 virus infection     Discharge Instructions      Please maintain adequate hydration Please take medications as prescribed Your quarantine ends today 07/08/2022.  You will need to wear a mask when you are around people until 07/13/2022 If you have any worsening symptoms  please return to urgent care to be reevaluated     ED Prescriptions     Medication Sig Dispense Auth. Provider   benzonatate (TESSALON) 100 MG capsule Take 1 capsule (100 mg total) by mouth 3 (three) times daily as needed for cough. 21 capsule Demaris Callander  O, MD   fluticasone (FLONASE) 50 MCG/ACT nasal spray Place 1 spray into both nostrils daily. 16 g Korayma Hagwood, Britta Mccreedy, MD      PDMP not reviewed this encounter.   Merrilee Jansky, MD 07/08/22 [phone]

## 2023-01-30 ENCOUNTER — Ambulatory Visit
Admission: RE | Admit: 2023-01-30 | Discharge: 2023-01-30 | Disposition: A | Discharge status: Home / Self Care | Payer: BC Managed Care – PPO | Source: Ambulatory Visit | Attending: Family Medicine | Admitting: Family Medicine

## 2023-01-30 VITALS — BP 124/75 | HR 67 | Temp 97.7°F | Resp 18 | Ht 74.0 in | Wt 229.0 lb

## 2023-01-30 DIAGNOSIS — J069 Acute upper respiratory infection, unspecified: Secondary | ICD-10-CM | POA: Diagnosis not present

## 2023-01-30 MED ORDER — AZITHROMYCIN 250 MG PO TABS: ORAL_TABLET | ORAL | 0 refills | Status: AC

## 2023-01-30 NOTE — Discharge Instructions (Signed)
Take plain guaifenesin (1200mg extended release tabs such as Mucinex) twice daily, with plenty of water, for cough and congestion.  May add Pseudoephedrine (30mg, one or two every 4 to 6 hours) for sinus congestion.  Get adequate rest.   May use Afrin nasal spray (or generic oxymetazoline) each morning for about 5 days and then discontinue.  Also recommend using saline nasal spray several times daily and saline nasal irrigation (AYR is a common brand).  Use Flonase nasal spray each morning after using Afrin nasal spray and saline nasal irrigation. Try warm salt water gargles for sore throat.  Stop all antihistamines (Nyquil, etc) for now, and other non-prescription cough/cold preparations. May take Delsym Cough Suppressant ("12 Hour Cough Relief") at bedtime for nighttime cough.  Begin Azithromycin if not improving about one week or if persistent fever develops    

## 2023-01-30 NOTE — ED Triage Notes (Signed)
Patient c/o dry cough x 4 days, congestion, some nasal drainage.  Afebrile.  Patient has taken OTC cold meds.

## 2023-01-30 NOTE — ED Provider Notes (Signed)
[facility]    CSN: [id number] Arrival date & time: 01/30/23  1328      History   Chief Complaint Chief Complaint  Patient presents with   Cough    Cough/cold since sunday - Entered by patient    HPI Mel[facility] is a 57 y.o. male.   Six days ago patient developed a mild sore throat and fatigue, followed by sinus congestion and mildly productive cough.  He has had chills but no fever.  He denies pleuritic pain and shortness of breath.  He has had a negative home COVID test.  The history is provided by the patient.    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 Authorizing Provider  atorvastatin (LIPITOR) 10 MG tablet Take 10 mg by mouth daily. 11/09/18  Yes [provider]  azithromycin (ZITHROMAX Z-PAK) 250 MG tablet Take 2 tabs today; then begin one tab once daily for 4 more days. 01/30/23  Yes Lattie Haw, MD  levothyroxine (SYNTHROID) 50 MCG tablet Take 50 mcg by mouth daily. 11/09/18  Yes [provider]  levothyroxine (SYNTHROID) 75 MCG tablet Take 75 mcg by mouth daily. 11/19/21  Yes [provider]  Multiple Vitamin (MULTI-VITAMIN PO) Take by mouth.   Yes [provider]  Omega-3 Fatty Acids (FISH OIL PO) Take by mouth.   Yes [provider]  benzonatate (TESSALON) 100 MG capsule Take 1 capsule (100 mg total) by mouth 3 (three) times daily as needed for cough. 07/08/22   Merrilee Jansky, MD  cetirizine (ZYRTEC) 10 MG tablet Take 1 tablet (10 mg total) by mouth daily. 12/23/18   Lurene Shadow, PA-C  fexofenadine (ALLEGRA ALLERGY) 180 MG tablet Take 1 tablet (180 mg total) by  mouth daily for 15 days. 04/23/22 05/08/22  Trevor Iha, FNP  fluticasone (FLONASE) 50 MCG/ACT nasal spray Place 1 spray into both nostrils daily. 07/08/22   LampteyBritta Mccreedy, MD  glucosamine-chondroitin 500-400 MG tablet Take by mouth.    [provider]  triamcinolone cream (KENALOG) 0.1 % Apply 1 application topically 2 (two) times daily. 12/23/18   Lurene Shadow, PA-C    Family History History reviewed. No pertinent family history.  Social History Social History   Tobacco Use   Smoking status: Never   Smokeless tobacco: Never  Vaping Use   Vaping Use: Never used  Substance Use Topics   Alcohol use: Not Currently    Comment: 1 q wk   Drug use: No     Allergies   Patient has no known allergies.   Review of Systems Review of Systems + sore throat + cough No pleuritic pain No wheezing + nasal congestion + post-nasal drainage No sinus pain/pressure No itchy/red eyes No earache No hemoptysis No SOB No fever, + chills No nausea No vomiting No abdominal pain No diarrhea No urinary symptoms No skin rash + fatigue No myalgias + headache Used OTC meds (day and night cold medication) without relief   Physical Exam Triage Vital Signs ED Triage Vitals  Enc Vitals Group     BP  01/30/23 1422 124/75     Pulse Rate 01/30/23 1422 67     Resp 01/30/23 1422 18     Temp 01/30/23 1422 97.7 F (36.5 C)     Temp Source 01/30/23 1422 Oral     SpO2 01/30/23 1422 96 %     Weight 01/30/23 1423 229 lb (103.9 kg)     Height 01/30/23 1423 6\' 2"  (1.88 m)     Head Circumference --      Peak Flow --      Pain Score 01/30/23 1423 2     Pain Loc --      Pain Edu? --      Excl. in GC? --    No data found.  Updated Vital Signs BP 124/75 (BP Location: Right Arm)   Pulse 67   Temp 97.7 F (36.5 C) (Oral)   Resp 18   Ht 6\' 2"  (1.88 m)   Wt 103.9 kg   SpO2 96%   BMI 29.40 kg/m   Visual Acuity Right Eye Distance:   Left Eye Distance:   Bilateral Distance:     Right Eye Near:   Left Eye Near:    Bilateral Near:     Physical Exam Nursing notes and Vital Signs reviewed. Appearance:  Patient appears stated age, and in no acute distress Eyes:  Pupils are equal, round, and reactive to light and accomodation.  Extraocular movement is intact.  Conjunctivae are not inflamed  Ears:  Canals normal.  Tympanic membranes normal.  Nose:  Mildly congested turbinates.  No sinus tenderness.  Pharynx:  Normal Neck:  Supple.  Mildly enlarged lateral nodes are present, tender to palpation on the left.   Lungs:  Clear to auscultation.  Breath sounds are equal.  Moving air well. Heart:  Regular rate and rhythm without murmurs, rubs, or gallops.  Abdomen:  Nontender without masses or hepatosplenomegaly.  Bowel sounds are present.  No CVA or flank tenderness.  Extremities:  No edema.  Skin:  No rash present.   UC Treatments / Results  Labs (all labs ordered are listed, but only abnormal results are displayed) Labs Reviewed - No data to display  EKG   Radiology No results found.  Procedures Procedures (including critical care time)  Medications Ordered in UC Medications - No data to display  Initial Impression / Assessment and Plan / UC Course  I have reviewed the triage vital signs and the nursing notes.  Pertinent labs & imaging results that were available during my care of the patient were reviewed by me and considered in my medical decision making (see chart for details).    Benign exam.  There is no evidence of bacterial infection today.  Treat symptomatically for now. If not improved in about one week, may begin Azithromycin (Given a prescription to hold, with an expiration date)  Followup with Family Doctor if not improved in about 10 days.  Final Clinical Impressions(s) / UC Diagnoses   Final diagnoses:  Viral URI with cough     Discharge Instructions      Take plain guaifenesin (1200mg  extended release tabs such as Mucinex) twice  daily, with plenty of water, for cough and congestion.  May add Pseudoephedrine (30mg , one or two every 4 to 6 hours) for sinus congestion.  Get adequate rest.   May use Afrin nasal spray (or generic oxymetazoline) each morning for about 5 days and then discontinue.  Also recommend using saline nasal spray several times daily and saline  nasal irrigation (AYR is a common brand).  Use Flonase nasal spray each morning after using Afrin nasal spray and saline nasal irrigation. Try warm salt water gargles for sore throat.  Stop all antihistamines (Nyquil, etc) for now, and other non-prescription cough/cold preparations. May take Delsym Cough Suppressant ("12 Hour Cough Relief") at bedtime for nighttime cough.  Begin Azithromycin if not improving about one week or if persistent fever develops         ED Prescriptions     Medication Sig Dispense Auth. Provider   azithromycin (ZITHROMAX Z-PAK) 250 MG tablet Take 2 tabs today; then begin one tab once daily for 4 more days. 6 tablet Lattie Haw, MD         Lattie Haw, MD 01/31/23 1110
